# Patient Record
Sex: Female | Born: 1995 | Race: White | Hispanic: No | Marital: Single | Smoking: Never smoker
Health system: Southern US, Community
[De-identification: ages and names within clinical notes are randomized; demographics above are authoritative.]

---
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
## 2014-01-18 ENCOUNTER — Ambulatory Visit: Payer: BC Managed Care – PPO | Admitting: Sports Medicine

## 2014-01-25 ENCOUNTER — Encounter: Payer: Self-pay | Admitting: Sports Medicine

## 2014-01-25 ENCOUNTER — Ambulatory Visit (INDEPENDENT_AMBULATORY_CARE_PROVIDER_SITE_OTHER): Payer: BC Managed Care – PPO | Admitting: Sports Medicine

## 2014-01-25 VITALS — BP 115/74 | Ht 64.0 in | Wt 135.0 lb

## 2014-01-25 DIAGNOSIS — M7650 Patellar tendinitis, unspecified knee: Secondary | ICD-10-CM

## 2014-01-25 NOTE — Assessment & Plan Note (Addendum)
18 yo F with L patellar tendonitis and partial tear of patellar tendon, progressing well and improved on ultrasound.  -Continue rehab exercises  -Advance activity as tolerated (can try some light sprinting, jumping). Let pain guide level of exertion.  -Goal to be able to play basketball at camp in see August. Will repeat her US before start of camp.  -Can continue nitroglycerin if seems beneficial - she has felt less pain on days she uses this  -Tenex procedure not needed at this time.

## 2014-01-25 NOTE — Progress Notes (Signed)
[facility] [address] [place], [place] [address] Phone: [phone] Fax: [phone]   Patient Name: Brittany Osborne Date of Birth: December 28, 1995 Medical Record Number: [id number] Gender: female Date of Encounter: 01/25/2014  History of Present Illness:  Brittany Osborne is a 18 y.o. very pleasant female patient who presents for follow up of her left patellar tendonopathy with partial tear after surgical treatment for left patellar fracture. At her last visit, she was still having a great deal of pain and so was instructed to take a break from competitive sports. She has done this and has continued with PT in the meantime. She reports that it is definitely feeling better and she has been able to progress somewhat. She is now doing some running and some plyometrics without pain. However, she still does have pain with things like squats. She states that the pain is different now. It used to be a more interior knee pain but is now really centered around the bottom edge of her patella and feels more external.  She has been managing pain with occasional ibuprofen. At her last visit, her nitroglycerin dose was increased and she did try that for a period of time but found that she was getting headaches. She has been using the nitroglycerin intermittently since that time without further side effects.  Patient Active Problem List   Diagnosis Date Noted  . Patellar tendonitis 08/24/2013   No past medical history on file. No past surgical history on file. History  Substance Use Topics  . Smoking status: Never Smoker   . Smokeless tobacco: Not on file  . Alcohol Use: Not on file   No family history on file. No Known Allergies  Medication list has been reviewed and updated.  Prior to Admission medications   Medication Sig Start Date End Date Taking? Authorizing Provider  LOTEMAX 0.5 % ophthalmic suspension  01/01/14   Historical Provider, MD  nitroGLYCERIN  (NITRODUR - DOSED IN MG/24 HR) 0.2 mg/hr patch 1/2 patch daily for 24 hours as directed 11/14/13   Enid BaasKarl Fields, MD    Review of Systems:  No fever, chills, skin rashes  Physical Examination: Filed Vitals:   01/25/14 1341  BP: 115/74   Filed Vitals:   01/25/14 1344  Height: 5\' 4"  (1.626 m)  Weight: 135 lb (61.236 kg)   Body mass index is 23.16 kg/(m^2).  Right Knee: Normal to inspection with no erythema or effusion or obvious bony abnormalities. Palpation normal with no warmth or patellar tenderness. ROM normal in flexion and extension. Non painful patellar compression. Hamstring and quadriceps strength is normal.  Left Knee: Scar along center of patella. No erythema or effusion. Palpation normal with no warmth or joint line tenderness. Mild tenderness to palpation along inferior patellar border. Minimal tenderness over patellar tendon. ROM normal in flexion and extension and lower leg rotation. Non painful patellar compression. Hamstring and quadriceps strength is normal.  Runs, jumps and does 1 leg hops with no pain  MSK US:  Compared to prior US the proximal tendon remains thick on left - ~ 0.85 vs. 0.58 RT Small defect in deep proximal tendon insertion remains but is much smaller Transverse scan shows small split and hypoechoic change but this is down to about 10% of tendon and was much more before Increased vascualrity at injury site  Assessment and Plan:  Patellar tendonitis 18 yo F with L patellar tendonitis and partial tear of patellar tendon, progressing well and improved on ultrasound.  -Continue  rehab exercises  -Advance activity as tolerated (can try some light sprinting, jumping). Let pain guide level of  exertion.  -Goal to be able to play basketball at camp in see August. Will before start of camp.  -Can continue nitroglycerin if seems beneficial  -Tenex procedure not needed at this time.    Radene Gunningameron E Lean Jaeger, MD  Reviewed and edited

## 2014-04-04 ENCOUNTER — Ambulatory Visit: Payer: BC Managed Care – PPO | Admitting: Sports Medicine

## 2014-04-17 ENCOUNTER — Ambulatory Visit (INDEPENDENT_AMBULATORY_CARE_PROVIDER_SITE_OTHER): Payer: BC Managed Care – PPO | Admitting: Sports Medicine

## 2014-04-17 ENCOUNTER — Encounter: Payer: Self-pay | Admitting: Sports Medicine

## 2014-04-17 VITALS — BP 117/64 | HR 49 | Ht 64.0 in | Wt 130.0 lb

## 2014-04-17 DIAGNOSIS — M7650 Patellar tendinitis, unspecified knee: Secondary | ICD-10-CM

## 2014-04-17 DIAGNOSIS — M7652 Patellar tendinitis, left knee: Secondary | ICD-10-CM

## 2014-04-17 NOTE — Assessment & Plan Note (Signed)
18 yo F with L patellar tendonitis and partial tear of patellar tendon, progressing well and improved on ultrasound.  -Continue rehab exercises  -Advance activity as tolerated working on sports specific exercise of jump shot, single leg box jumps and jump stop. Let pain guide level of exertion.  -Goal to be able to play basketball this season -Can restart nitro protocol if pain return -Tenex procedure not needed at this time.

## 2014-04-17 NOTE — Progress Notes (Signed)
  [name] - 18 y.o. female MRN [id number]  Date of birth: 1996/05/26  SUBJECTIVE:  Including CC & ROS.  [name] is a 18 y.o. very pleasant female patient who presents for follow up of her left patellar tendonopathy with partial tear after surgical treatment for left patellar fracture. Since his last visit she has had significant improvement in pain and strength to 95%.  She has done this and has continued with home exercises in the meantime. She reports that it is definitely feeling better and she has been able to progress somewhat with minimal to no pain with jumping. She is now doing some running and some plyometrics without pain. If she does have some discomfort after playing basketball or with excessive strength training exercises it's mostly localized at the proximal attachment of the patella tendon onto the patella She has been managing pain with occasional ibuprofen and ice. She has since discontinued the nitro protocol.    ROS: Review of systems otherwise negative except for information present in HPI  HISTORY: Past Medical, Surgical, Social, and Family History Reviewed & Updated per EMR. Pertinent Historical Findings include: RT knee patellar fracture  DATA REVIEWED: Previous MSK ultrasound reviewed with evidence of hypoechoic changes at at the deep proximal tendon insertion in the longitudinal view  PHYSICAL EXAM:  VS: BP:117/64 mmHg  HR:49bpm  TEMP: ( )  RESP:   HT:5\' 4"  (162.6 cm)   WT:130 lb (58.968 kg)  BMI:22.4 PHYSICAL EXAM: Left Knee:  Scar along center of patella. No erythema or effusion.  Palpation normal with no warmth or joint line tenderness. Mild tenderness to palpation along inferior patellar border. Minimal tenderness over patellar tendon.  ROM normal in flexion and extension and lower leg rotation.  Non painful patellar compression.  Hamstring and quadriceps strength is normal.   Runs, jumps and does 1 leg hops with no pain   MSK US:  Compared to  prior US the proximal tendon remains thick on left - ~ 0.75 (improved from .85 previously) vs. 0.47 RT  Small defect in deep proximal tendon insertion remains but is much smaller  Resolution of increased vascualrity at injury site  ASSESSMENT & PLAN: See problem based charting & AVS for pt instructions.

## 2014-04-17 NOTE — Patient Instructions (Signed)
Function testing looking for pain and fatigue: 8 inch jump stops - box jumps  Slides drills jump on each leg to back board to see if you fatigue  Continue quad strengthening  Ice after activity

## 2014-08-27 ENCOUNTER — Ambulatory Visit
Admission: RE | Admit: 2014-08-27 | Discharge: 2014-08-27 | Disposition: A | Discharge status: Home / Self Care | Payer: BC Managed Care – PPO | Source: Ambulatory Visit | Attending: Sports Medicine | Admitting: Sports Medicine

## 2014-08-27 ENCOUNTER — Ambulatory Visit (INDEPENDENT_AMBULATORY_CARE_PROVIDER_SITE_OTHER): Payer: BLUE CROSS/BLUE SHIELD | Admitting: Sports Medicine

## 2014-08-27 ENCOUNTER — Encounter: Payer: Self-pay | Admitting: Sports Medicine

## 2014-08-27 VITALS — BP 122/72 | HR 50 | Ht 64.0 in | Wt 132.0 lb

## 2014-08-27 DIAGNOSIS — M25551 Pain in right hip: Secondary | ICD-10-CM | POA: Diagnosis not present

## 2014-08-27 DIAGNOSIS — R1030 Lower abdominal pain, unspecified: Secondary | ICD-10-CM

## 2014-08-27 DIAGNOSIS — R1031 Right lower quadrant pain: Secondary | ICD-10-CM

## 2014-08-27 DIAGNOSIS — M84353A Stress fracture, unspecified femur, initial encounter for fracture: Secondary | ICD-10-CM | POA: Insufficient documentation

## 2014-08-27 NOTE — Progress Notes (Signed)
   Subjective:    Patient ID: Brittany Osborne, female    DOB: 1996/06/27, 18 y.o.   MRN: [id number]  HPI  Right Hip Pain:  Patient presents to sports med clinic, for initial evaluation of right hip pain that started approximately 4 weeks ago. Patient doesn't recall any injury, she noticed it started bothering her or doing exercises. She reports the pain is a shooting pain, that can sometimes radiate around from her right anterior hip to her buttocks.   She denies any popping or clicking, she endorses a catching feeling at times. She was seen by her trainer at school, and was given exercises and stretches for gluteus and abdominal strength. she states she has been tolerating the stretches, except for the resistant bands.  She is active in basketball in running. She endorses increase pain with jumping and her step off.  She feels that the pain is worse since the original injury.  She has a history of patellar tendinitis and patellar fracture on the left. She denies prior hip trauma.   Nonsmoker No Known Allergies No past medical history on file.  Review of Systems Per HPI    Objective:   Physical Exam BP 122/72 mmHg  Pulse 50  Ht 5\' 4"  (1.626 m)  Wt 132 lb (59.875 kg)  BMI 22.65 kg/m2 Gen:  Pleasant, Caucasian female, no acute distress, nontoxic in appearance, well-developed, well-nourished, physically fit, ,alert, oriented 3. MSK:  TTP anterior hip joint. Positive C Sign. Full range of motion bilateral lower extremities. >1cm leg length difference right shorter than left. No discomfort with hip internal/external rotation. Negative FABRE, positive FADIR right.  5/5  leg Extension/flexion , dorsiflexion/plantarflexion.  Pain/ weakness with hip flexion and resisted internal rotation right, left normal. 5/5 abduction/adduction of hip. Pain with jumping on affected side and non-affected side at right anterior hip joint. Neurovascularly intact distally.   US  Right anterior hip: Hypoechoic area  above right femoral head. Questionable labral tear/splitting. Iliopsoas tendon intact. Femoral head appears normal as does neck.  XRay - normal RT hip     Assessment & Plan:  Brittany Osborne is a 18 y.o.  Female athlete with questionable right hip labral tear by ultrasound and exam today. -  X-ray of right hip today -  MRI of right hip on Wednesday. -  Pain as her guide for activity -  Follow-up plan, dependent upon MRI results.

## 2014-08-27 NOTE — Assessment & Plan Note (Signed)
[employer]ike for [employer]  No weight bearing  Await MRI and decide on TX plan

## 2014-08-29 ENCOUNTER — Other Ambulatory Visit: Payer: Self-pay | Admitting: *Deleted

## 2014-08-29 ENCOUNTER — Ambulatory Visit
Admission: RE | Admit: 2014-08-29 | Discharge: 2014-08-29 | Disposition: A | Discharge status: Home / Self Care | Payer: BC Managed Care – PPO | Source: Ambulatory Visit | Attending: Sports Medicine | Admitting: Sports Medicine

## 2014-08-29 DIAGNOSIS — M25551 Pain in right hip: Secondary | ICD-10-CM

## 2014-09-11 ENCOUNTER — Ambulatory Visit (INDEPENDENT_AMBULATORY_CARE_PROVIDER_SITE_OTHER): Payer: BLUE CROSS/BLUE SHIELD | Admitting: Sports Medicine

## 2014-09-11 ENCOUNTER — Encounter: Payer: Self-pay | Admitting: Sports Medicine

## 2014-09-11 VITALS — BP 113/68 | Ht 64.0 in | Wt 132.0 lb

## 2014-09-11 DIAGNOSIS — M84353D Stress fracture, unspecified femur, subsequent encounter for fracture with routine healing: Secondary | ICD-10-CM

## 2014-09-11 DIAGNOSIS — M84359D Stress fracture, hip, unspecified, subsequent encounter for fracture with routine healing: Secondary | ICD-10-CM

## 2014-09-11 NOTE — Progress Notes (Signed)
Patient ID: Brittany Osborne, female   DOB: 1996/01/14, 19 y.o.   MRN: [id number]  Patient has been on crutches for the past 2 weeks for I right femoral neck stress fracture on compression side She has very little pain since beginning crutches and states that 70% of pain is down She was using Advil every day and now has not used any for over a week  She has been resting since last seen and not crosstraining She is getting calcium and vitamin D She was sent the stress fracture rehabilitation protocol  She comes back today for advice on ongoing care  Examination Athletic female in no acute distress on crutches BP 113/68 mmHg  Ht 5\' 4"  (1.626 m)  Wt 132 lb (59.875 kg)  BMI 22.65 kg/m2  Right hip Full range of motion without pain Good strength in quadriceps and hamstrings Good hip flexion and abduction strength without pain Hip adduction causes mild pain Pearlean BrownieFaber - tight at end range  Walking - can do this without a limp and no pain without crutches  Hop test - pain by 5th hop

## 2014-09-11 NOTE — Assessment & Plan Note (Signed)
She is to follow a standard femoral stress fracture rehabilitation program  Since [facility] will be [facility] [employer] I want her to see one of the sports physicians there   She will return for my evaluation as needed

## 2014-09-11 NOTE — Patient Instructions (Signed)
Progress note on Brittany Osborne  Now with 60 to 70% of pain resolved as long as no weight bearing Not using advil now Has been on crutches 2 weeks  Today walks without a limp Still has + hop test  4 weeks from now if she can pass a hop test and jog slowly without pain she can start on Basic phase of stress fracture rehab protocol  If that goes well and a recheck at 8 weeks is good she can progress to the Resumption of training phase  I recommend continuing Calcium and Vitamin D  If any problems as she progresses she is to slow down the rehab plan by 1 week  For Cross training: If no pain can start on a stationary bike at low resistance for 30 to 45 mins. Swimming is OK if no pain but no breast stroke or butterfly  If you have access to an Al[employer] G treadmill you could start running protocol at 50% body weight and progress weekly by adding 10% body weight up to 30 mins. Pool running in vest may be OK as well  Hold any cross training that causes pain

## 2016-11-02 IMAGING — MR MR HIP*R* W/O CM
4 of 5 series · 19 of 40 positions shown · non-contrast
Comparison: 08/27/2014

CLINICAL DATA: Right hip and groin pain for 4 weeks. Right hip
weakness. Numbness down the medial aspect of the right thigh. The
patient is a basketball player.

EXAM:
MR OF THE RIGHT HIP WITHOUT CONTRAST
TECHNIQUE: Multiplanar, multisequence MR imaging was performed. No intravenous
contrast was administered.

[Series 2: T1 · axial · 5.0mm · 0.74mm/px · z∈[-28,+104]mm · 3 of 32 slices shown (1 of 2)]
[im 5/32]
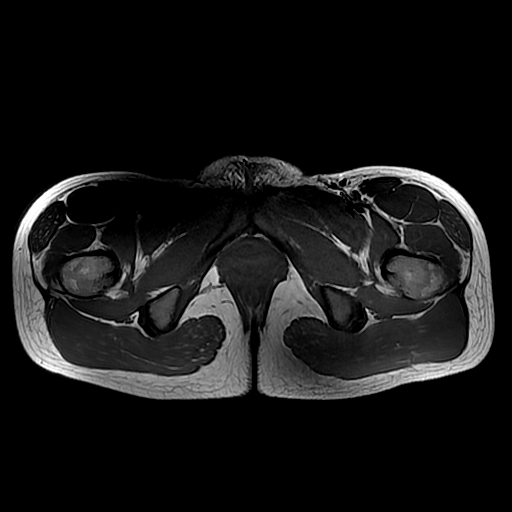
[im 18/32]
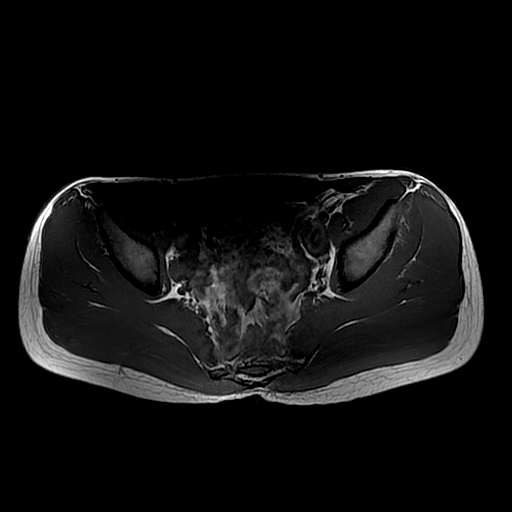
[im 27/32]
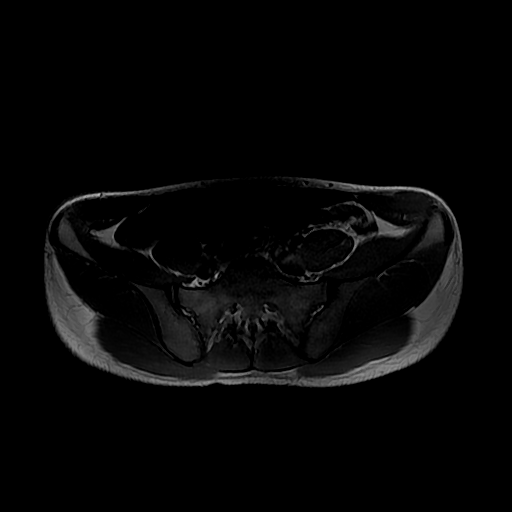

[Series 3: T2 fat-sat · axial · 5.0mm · 0.74mm/px · z∈[-52,+110]mm · 6 of 32 slices shown]
[im 1/32]
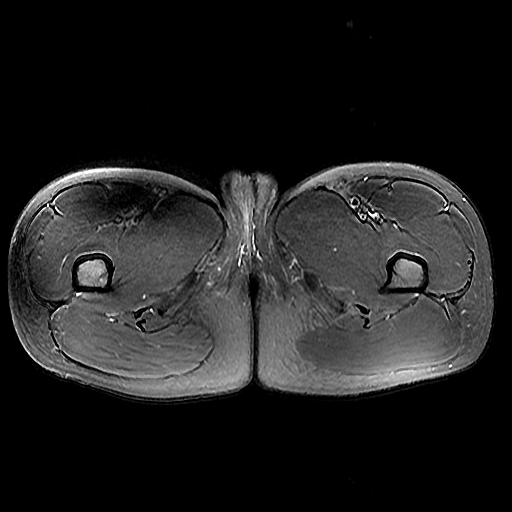
[im 4/32]
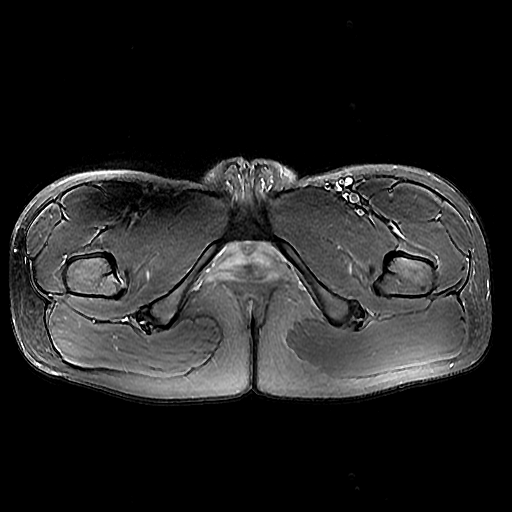
[im 8/32]
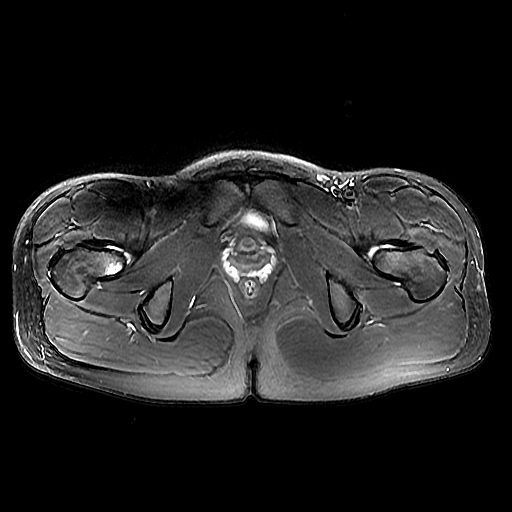
[im 12/32]
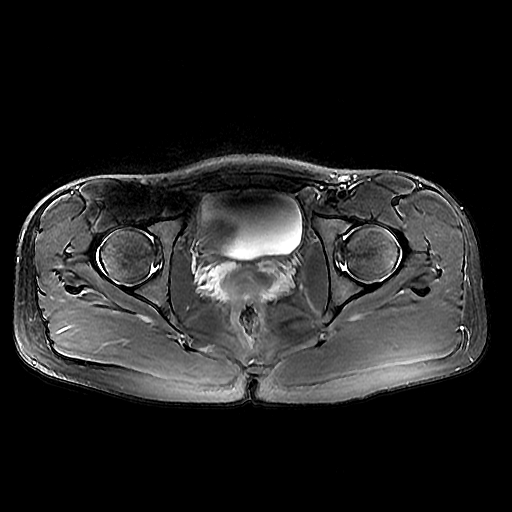
[im 16/32]
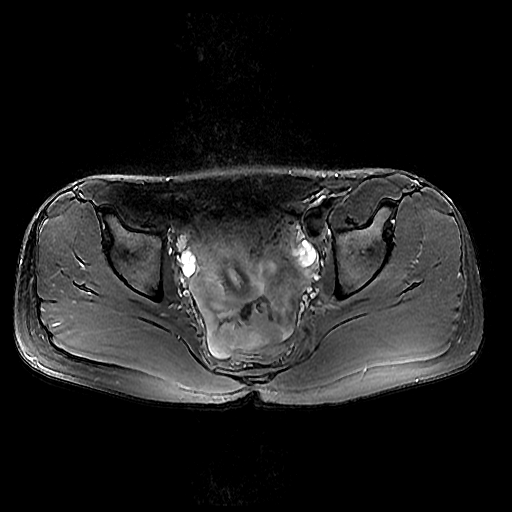
[im 28/32]
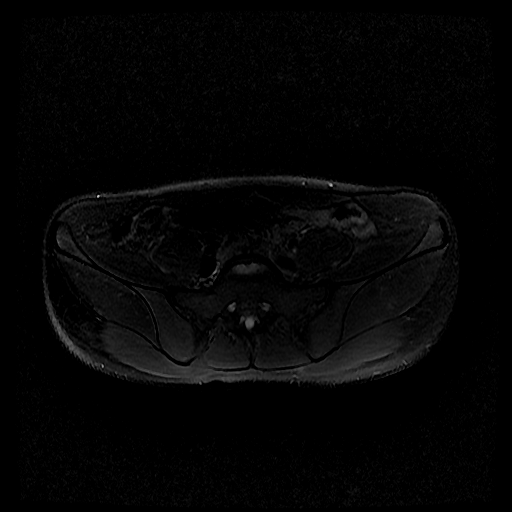

[Series 4: T1 · coronal · 4.0mm · 0.72mm/px · 3 of 28 slices shown (2 of 2)]
[im 4/28]
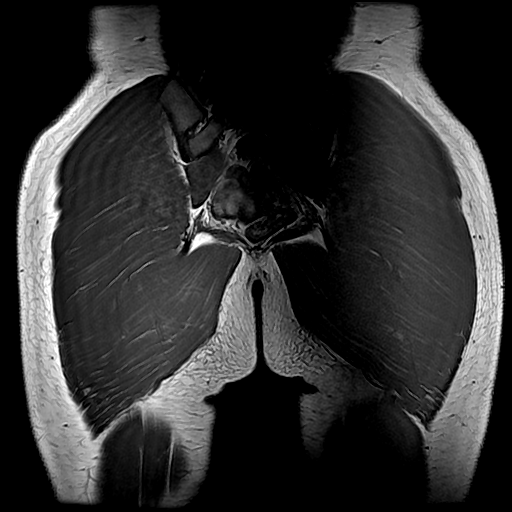
[im 16/28]
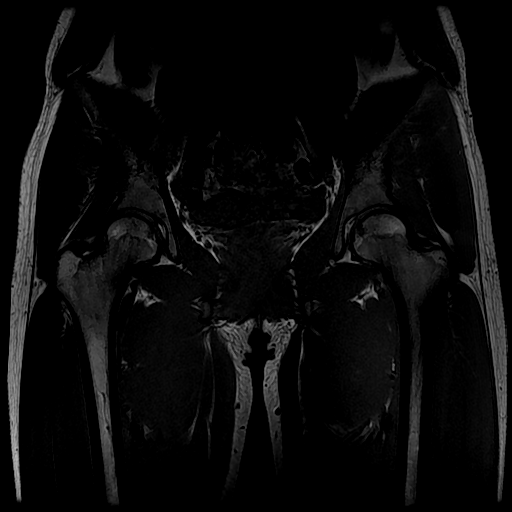
[im 24/28]
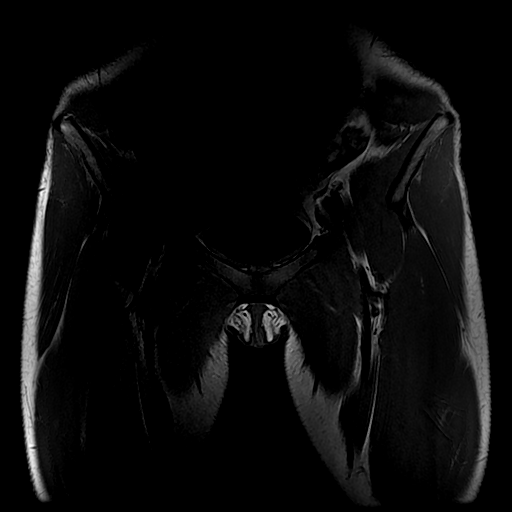

[Series 6: PD fat-sat · sagittal · 4.0mm · 0.43mm/px · 7 of 26 slices shown]
[im 1/26]
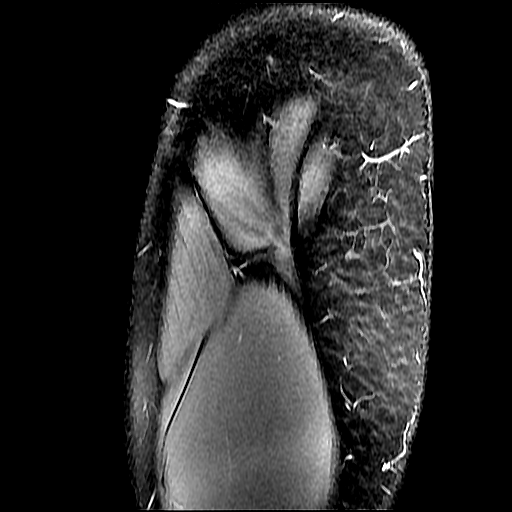
[im 5/26]
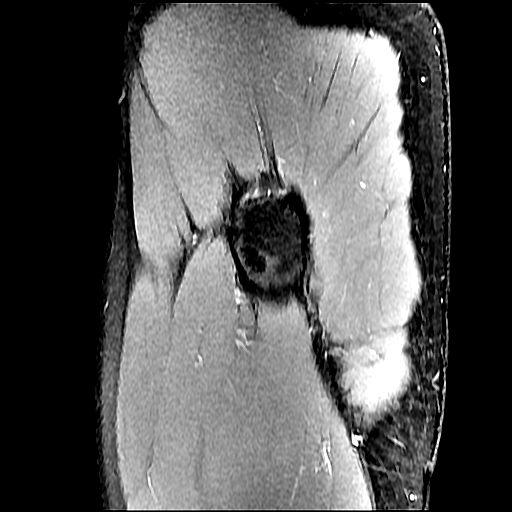
[im 9/26]
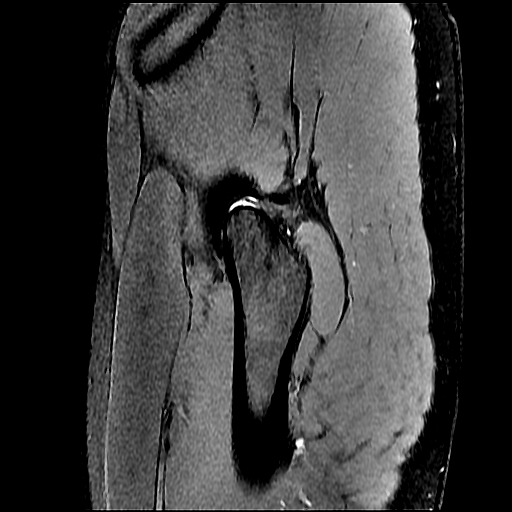
[im 13/26]
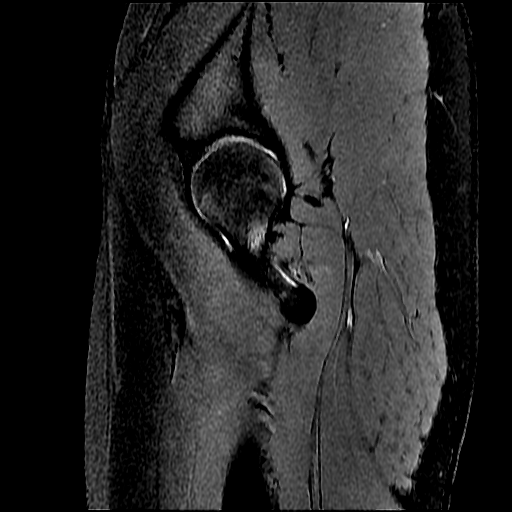
[im 17/26]
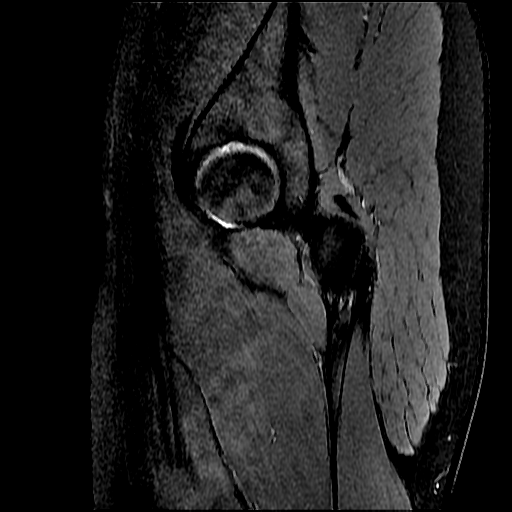
[im 21/26]
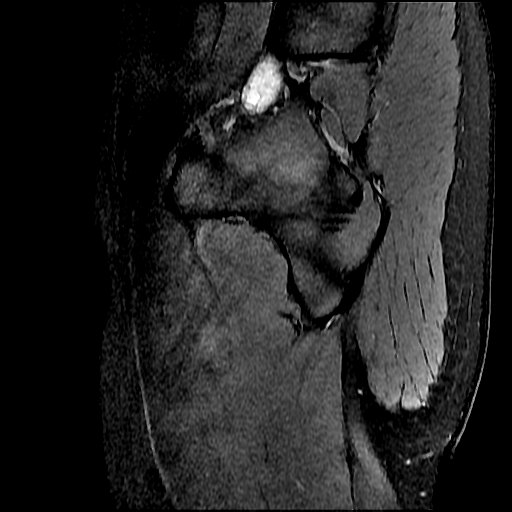
[im 26/26]
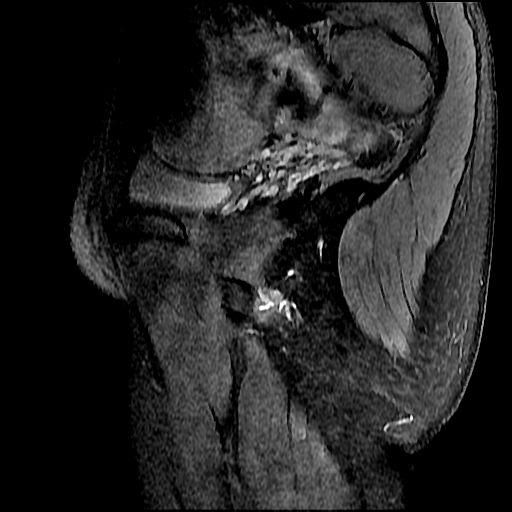

[19 of 40 positions shown; findings below may reference images not displayed]

FINDINGS: Bones: There is a prominent stress reaction in the medial aspect of
the right femoral neck without a visible stress fracture. The other
bones appear normal.

Articular cartilage and labrum

Articular cartilage:  Normal.

Labrum:  Normal.

Joint or bursal effusion

Joint effusion:  Normal.

Bursae:  Normal.

Muscles and tendons

Muscles and tendons:  Normal.

Other findings

Miscellaneous:  No adenopathy or mass lesions.
IMPRESSION: Prominent stress reaction on the medial aspect of the right femoral
neck without a discrete stress fracture.
